# Patient Record
Sex: Male | Born: 1985 | Race: Black or African American | Hispanic: No | Marital: Single
Health system: Southern US, Community
[De-identification: ages and names within clinical notes are randomized; demographics above are authoritative.]

---

## 2019-11-17 ENCOUNTER — Emergency Department (HOSPITAL_COMMUNITY)
Admission: EM | Admit: 2019-11-17 | Discharge: 2019-11-17 | Disposition: A | Discharge status: Home / Self Care | Payer: Medicaid - Out of State | Attending: Emergency Medicine | Admitting: Emergency Medicine

## 2019-11-17 ENCOUNTER — Emergency Department (HOSPITAL_COMMUNITY): Payer: Medicaid - Out of State

## 2019-11-17 ENCOUNTER — Other Ambulatory Visit: Payer: Self-pay

## 2019-11-17 DIAGNOSIS — R519 Headache, unspecified: Secondary | ICD-10-CM | POA: Diagnosis not present

## 2019-11-17 DIAGNOSIS — R1033 Periumbilical pain: Secondary | ICD-10-CM | POA: Insufficient documentation

## 2019-11-17 DIAGNOSIS — R630 Anorexia: Secondary | ICD-10-CM | POA: Insufficient documentation

## 2019-11-17 DIAGNOSIS — Z20822 Contact with and (suspected) exposure to covid-19: Secondary | ICD-10-CM | POA: Insufficient documentation

## 2019-11-17 DIAGNOSIS — R197 Diarrhea, unspecified: Secondary | ICD-10-CM | POA: Diagnosis not present

## 2019-11-17 DIAGNOSIS — R1110 Vomiting, unspecified: Secondary | ICD-10-CM

## 2019-11-17 DIAGNOSIS — R509 Fever, unspecified: Secondary | ICD-10-CM

## 2019-11-17 DIAGNOSIS — R112 Nausea with vomiting, unspecified: Secondary | ICD-10-CM | POA: Insufficient documentation

## 2019-11-17 DIAGNOSIS — R5383 Other fatigue: Secondary | ICD-10-CM | POA: Insufficient documentation

## 2019-11-17 DIAGNOSIS — R000 Tachycardia, unspecified: Secondary | ICD-10-CM | POA: Insufficient documentation

## 2019-11-17 LAB — CBC
HCT: 45.3 % (ref 39.0–52.0)
Hemoglobin: 15.5 g/dL (ref 13.0–17.0)
MCH: 28.7 pg (ref 26.0–34.0)
MCHC: 34.2 g/dL (ref 30.0–36.0)
MCV: 83.7 fL (ref 80.0–100.0)
Platelets: 89 10*3/uL — ABNORMAL LOW (ref 150–400)
RBC: 5.41 MIL/uL (ref 4.22–5.81)
RDW: 12.5 % (ref 11.5–15.5)
WBC: 4.1 10*3/uL (ref 4.0–10.5)
nRBC: 0 % (ref 0.0–0.2)

## 2019-11-17 LAB — URINALYSIS, ROUTINE W REFLEX MICROSCOPIC
Bilirubin Urine: NEGATIVE
Glucose, UA: NEGATIVE mg/dL
Ketones, ur: NEGATIVE mg/dL
Leukocytes,Ua: NEGATIVE
Nitrite: NEGATIVE
Protein, ur: 100 mg/dL — AB
Specific Gravity, Urine: 1.012 (ref 1.005–1.030)
pH: 5 (ref 5.0–8.0)

## 2019-11-17 LAB — RESPIRATORY PANEL BY RT PCR (FLU A&B, COVID)
Influenza A by PCR: NEGATIVE
Influenza B by PCR: NEGATIVE
SARS Coronavirus 2 by RT PCR: NEGATIVE

## 2019-11-17 LAB — COMPREHENSIVE METABOLIC PANEL
ALT: 55 U/L — ABNORMAL HIGH (ref 0–44)
AST: 119 U/L — ABNORMAL HIGH (ref 15–41)
Albumin: 3.6 g/dL (ref 3.5–5.0)
Alkaline Phosphatase: 56 U/L (ref 38–126)
Anion gap: 14 (ref 5–15)
BUN: 15 mg/dL (ref 6–20)
CO2: 21 mmol/L — ABNORMAL LOW (ref 22–32)
Calcium: 8.2 mg/dL — ABNORMAL LOW (ref 8.9–10.3)
Chloride: 95 mmol/L — ABNORMAL LOW (ref 98–111)
Creatinine, Ser: 1.7 mg/dL — ABNORMAL HIGH (ref 0.61–1.24)
GFR, Estimated: 54 mL/min — ABNORMAL LOW (ref >60)
Glucose, Bld: 130 mg/dL — ABNORMAL HIGH (ref 70–99)
Potassium: 4.1 mmol/L (ref 3.5–5.1)
Sodium: 130 mmol/L — ABNORMAL LOW (ref 135–145)
Total Bilirubin: 0.8 mg/dL (ref 0.3–1.2)
Total Protein: 7.9 g/dL (ref 6.5–8.1)

## 2019-11-17 LAB — PROTIME-INR
INR: 1 (ref 0.8–1.2)
Prothrombin Time: 12.3 seconds (ref 11.4–15.2)

## 2019-11-17 LAB — APTT: aPTT: 35 seconds (ref 24–36)

## 2019-11-17 LAB — LACTIC ACID, PLASMA: Lactic Acid, Venous: 1.4 mmol/L (ref 0.5–1.9)

## 2019-11-17 LAB — BASIC METABOLIC PANEL
Anion gap: 8 (ref 5–15)
BUN: 18 mg/dL (ref 6–20)
CO2: 24 mmol/L (ref 22–32)
Calcium: 7.8 mg/dL — ABNORMAL LOW (ref 8.9–10.3)
Chloride: 97 mmol/L — ABNORMAL LOW (ref 98–111)
Creatinine, Ser: 1.73 mg/dL — ABNORMAL HIGH (ref 0.61–1.24)
GFR, Estimated: 53 mL/min — ABNORMAL LOW (ref >60)
Glucose, Bld: 108 mg/dL — ABNORMAL HIGH (ref 70–99)
Potassium: 4.1 mmol/L (ref 3.5–5.1)
Sodium: 129 mmol/L — ABNORMAL LOW (ref 135–145)

## 2019-11-17 LAB — LIPASE, BLOOD: Lipase: 71 U/L — ABNORMAL HIGH (ref 11–51)

## 2019-11-17 MED ORDER — LACTATED RINGERS IV BOLUS
2000.0000 mL | Freq: Once | INTRAVENOUS | Status: AC
  Administered 2019-11-17: 2000 mL via INTRAVENOUS

## 2019-11-17 MED ORDER — ONDANSETRON 4 MG PO TBDP
4.0000 mg | ORAL_TABLET | Freq: Once | ORAL | Status: AC
  Administered 2019-11-17: 4 mg via ORAL
  Filled 2019-11-17: qty 1

## 2019-11-17 MED ORDER — ACETAMINOPHEN 325 MG PO TABS
650.0000 mg | ORAL_TABLET | Freq: Once | ORAL | Status: AC
  Administered 2019-11-17: 650 mg via ORAL
  Filled 2019-11-17: qty 2

## 2019-11-17 MED ORDER — KETOROLAC TROMETHAMINE 15 MG/ML IJ SOLN
15.0000 mg | Freq: Once | INTRAMUSCULAR | Status: AC
  Administered 2019-11-17: 15 mg via INTRAVENOUS
  Filled 2019-11-17: qty 1

## 2019-11-17 MED ORDER — ONDANSETRON 4 MG PO TBDP
4.0000 mg | ORAL_TABLET | Freq: Once | ORAL | Status: AC | PRN
  Administered 2019-11-17: 4 mg via ORAL
  Filled 2019-11-17: qty 1

## 2019-11-17 MED ORDER — ONDANSETRON 4 MG PO TBDP: 4.0000 mg | ORAL_TABLET | Freq: Three times a day (TID) | ORAL | 0 refills | Status: AC | PRN

## 2019-11-17 NOTE — ED Notes (Signed)
Reviewed discharge instructions with patient and significant other. Follow-up care and medications reviewed. Patient and significant other verbalized understanding. Patient A&Ox4, VSS, upon discharge.

## 2019-11-17 NOTE — ED Triage Notes (Addendum)
Pt to ED c/o not feeling well and generalized weakness for 3 days, report febrile, headache, vomiting, unable to keep food down. Pt reports no known COVID exposure, has not been vaccinated for COVID. Reports no pre existing medical problems, does not take any medication

## 2019-11-17 NOTE — Discharge Instructions (Signed)
Hydrate well over the next several days, you need to drink at least 4-5 bottles of water a day.  Use the Zofran for nausea.  Return with worsening belly pain, return with inability to tolerate oral hydration.  When she gets her self evaluated again in a day or 2 either here or by urgent care or with your primary care provider.  Repeat laboratory studies to check for kidney function and signs of dehydration would be warranted

## 2019-11-17 NOTE — ED Provider Notes (Signed)
[facility] EMERGENCY DEPARTMENT Provider Note   CSN: [id number] Arrival date & time: 11/17/19  1221     History Chief Complaint  Patient presents with  . Abdominal Pain  . Fever  . Headache    Benjamin Grimes is a 34 y.o. male.   Abdominal Pain Pain location:  Periumbilical and epigastric Pain quality: aching   Pain radiates to:  Does not radiate Pain severity:  Moderate Onset quality:  Gradual Duration:  4 days Timing:  Constant Progression:  Waxing and waning Chronicity:  New Context: alcohol use and recent illness   Relieved by:  Nothing Worsened by:  Nothing Ineffective treatments:  None tried Associated symptoms: anorexia, chills, diarrhea, fatigue, fever, nausea and vomiting   Associated symptoms: no chest pain, no cough, no dysuria, no hematemesis, no hematochezia, no hematuria and no shortness of breath   Fever Associated symptoms: chills, diarrhea, headaches, nausea and vomiting   Associated symptoms: no chest pain, no congestion, no cough, no dysuria, no rash and no rhinorrhea   Headache Associated symptoms: abdominal pain, diarrhea, fatigue, fever, nausea and vomiting   Associated symptoms: no back pain, no congestion and no cough        No past medical history on file.  There are no problems to display for this patient.    The histories are not reviewed yet. Please review them in the "History" navigator section and refresh this SmartLink.     No family history on file.  Social History   Tobacco Use  . Smoking status: Not on file  Substance Use Topics  . Alcohol use: Not on file  . Drug use: Not on file    Home Medications Prior to Admission medications   Medication Sig Start Date End Date Taking? Authorizing Provider  ibuprofen (ADVIL) 600 MG tablet Take 600 mg by mouth every 6 (six) hours as needed for mild pain.    Yes [provider]  ondansetron (ZOFRAN ODT) 4 MG disintegrating tablet Take 1 tablet (4  mg total) by mouth every 8 (eight) hours as needed for up to 10 doses for nausea or vomiting. 11/17/19   Sabino Donovan, MD    Allergies    Clindamycin  Review of Systems   Review of Systems  Constitutional: Positive for chills, fatigue and fever.  HENT: Negative for congestion and rhinorrhea.   Respiratory: Negative for cough and shortness of breath.   Cardiovascular: Negative for chest pain and palpitations.  Gastrointestinal: Positive for abdominal pain, anorexia, diarrhea, nausea and vomiting. Negative for hematemesis and hematochezia.  Genitourinary: Negative for difficulty urinating, dysuria and hematuria.  Musculoskeletal: Negative for arthralgias and back pain.  Skin: Negative for color change and rash.  Neurological: Positive for headaches. Negative for light-headedness.    Physical Exam Updated Vital Signs BP 115/86   Pulse 93   Temp 98.1 F (36.7 C) (Oral)   Resp (!) 23   Ht 6' (1.829 m)   Wt 118.8 kg   SpO2 96%   BMI 35.53 kg/m   Physical Exam Vitals and nursing note reviewed. Exam conducted with a chaperone present.  Constitutional:      General: He is not in acute distress.    Appearance: Normal appearance.  HENT:     Head: Normocephalic and atraumatic.     Nose: No rhinorrhea.  Eyes:     General:        Right eye: No discharge.        Left eye:  No discharge.     Conjunctiva/sclera: Conjunctivae normal.  Cardiovascular:     Rate and Rhythm: Regular rhythm. Tachycardia present.     Heart sounds: No murmur heard.   Pulmonary:     Effort: Pulmonary effort is normal.     Breath sounds: No stridor.  Abdominal:     General: Abdomen is flat. There is no distension.     Palpations: Abdomen is soft.     Tenderness: There is generalized abdominal tenderness. There is no right CVA tenderness, left CVA tenderness, guarding or rebound. Negative signs include Murphy's sign, Rovsing's sign and McBurney's sign.  Musculoskeletal:        General: No deformity or  signs of injury.  Skin:    General: Skin is warm and dry.  Neurological:     General: No focal deficit present.     Mental Status: He is alert. Mental status is at baseline.     Motor: No weakness.  Psychiatric:        Mood and Affect: Mood normal.        Behavior: Behavior normal.        Thought Content: Thought content normal.     ED Results / Procedures / Treatments   Labs (all labs ordered are listed, but only abnormal results are displayed) Labs Reviewed  LIPASE, BLOOD - Abnormal; Notable for the following components:      Result Value   Lipase 71 (*)    All other components within normal limits  COMPREHENSIVE METABOLIC PANEL - Abnormal; Notable for the following components:   Sodium 130 (*)    Chloride 95 (*)    CO2 21 (*)    Glucose, Bld 130 (*)    Creatinine, Ser 1.70 (*)    Calcium 8.2 (*)    AST 119 (*)    ALT 55 (*)    GFR, Estimated 54 (*)    All other components within normal limits  CBC - Abnormal; Notable for the following components:   Platelets 89 (*)    All other components within normal limits  URINALYSIS, ROUTINE W REFLEX MICROSCOPIC - Abnormal; Notable for the following components:   APPearance HAZY (*)    Hgb urine dipstick MODERATE (*)    Protein, ur 100 (*)    Bacteria, UA RARE (*)    All other components within normal limits  BASIC METABOLIC PANEL - Abnormal; Notable for the following components:   Sodium 129 (*)    Chloride 97 (*)    Glucose, Bld 108 (*)    Creatinine, Ser 1.73 (*)    Calcium 7.8 (*)    GFR, Estimated 53 (*)    All other components within normal limits  RESPIRATORY PANEL BY RT PCR (FLU A&B, COVID)  CULTURE, BLOOD (SINGLE)  URINE CULTURE  LACTIC ACID, PLASMA  PROTIME-INR  APTT    EKG None  Radiology DG Chest Port 1 View  Result Date: 11/17/2019 CLINICAL DATA:  Possible sepsis EXAM: PORTABLE CHEST 1 VIEW COMPARISON:  None. FINDINGS: Numerous leads and wires project over the chest. Midline trachea. Normal heart  size for level of inspiration. No pleural effusion or pneumothorax. Clear lungs. IMPRESSION: Normal chest. Electronically Signed   By: Jeronimo Greaves M.D.   On: 11/17/2019 16:53   US Abdomen Limited RUQ (LIVER/GB)  Result Date: 11/17/2019 CLINICAL DATA:  Vomiting. EXAM: ULTRASOUND ABDOMEN LIMITED RIGHT UPPER QUADRANT COMPARISON:  None. FINDINGS: Gallbladder: No gallstones or wall thickening visualized. No sonographic Murphy sign noted by sonographer.  Common bile duct: Diameter: 3.8 mm Liver: Normal echogenicity without focal lesion or biliary dilatation. Portal vein is patent on color Doppler imaging with normal direction of blood flow towards the liver. Other: None. IMPRESSION: Normal right upper quadrant ultrasound examination. Electronically Signed   By: Rudie Meyer M.D.   On: 11/17/2019 19:22    Procedures Procedures (including critical care time)  Medications Ordered in ED Medications  ondansetron (ZOFRAN-ODT) disintegrating tablet 4 mg (4 mg Oral Given 11/17/19 1245)  acetaminophen (TYLENOL) tablet 650 mg (650 mg Oral Given 11/17/19 1245)  lactated ringers bolus 2,000 mL (0 mLs Intravenous Stopped 11/17/19 1946)  ketorolac (TORADOL) 15 MG/ML injection 15 mg (15 mg Intravenous Given 11/17/19 1723)  ondansetron (ZOFRAN-ODT) disintegrating tablet 4 mg (4 mg Oral Given 11/17/19 1723)    ED Course  I have reviewed the triage vital signs and the nursing notes.  Pertinent labs & imaging results that were available during my care of the patient were reviewed by me and considered in my medical decision making (see chart for details).    MDM Rules/Calculators/A&P                          Gastrointestinal symptoms with fevers chills for 4 days.  Hemodynamically stable, mild tachycardia initially febrile.  Screening labs sent showed no significant leukocytosis, there is an elevation creatinine but no baseline to compare to.  Patient will be hydrated will likely get repeat lab testing.  I  noticed in reviewing his labs his lipase is slightly up, he did have an episode of drinking prior to all this however is likely infectious given the fever.  His abdomen is without signs of peritonitis he is overall well-appearing.  He will be given Toradol he will be given Zofran IV fluids.  Lactic acid pending, culture sent, urinalysis without significant signs of infection, chest x-ray pending.  Ultrasound shows no hepatobiliary dysfunction.  Patient's feeling much better after IV hydration and Zofran.  Repeat laboratory testing shows improvement of metabolic acidosis, consistent creatinine.  With no previous labs and patient not following with primary care for routine screenings I do not have a baseline creatinine and this may be it.  No source for deep space bacterial infection but cultures are pending.  With improved vital signs improved patient status I do feel he is safe for discharge home.  Patient agrees to this and he will get reevaluated in a day or 2.  Pt is safe for DC home with outpatient follow up. The patient  agrees with the plan and has no other questions or concerns.   Final Clinical Impression(s) / ED Diagnoses Final diagnoses:  Vomiting  Diarrhea in adult patient  Fever in adult    Rx / DC Orders ED Discharge Orders         Ordered    ondansetron (ZOFRAN ODT) 4 MG disintegrating tablet  Every 8 hours PRN        11/17/19 2251           Sabino Donovan, MD 11/17/19 2252

## 2019-11-19 ENCOUNTER — Other Ambulatory Visit: Payer: Self-pay

## 2019-11-19 ENCOUNTER — Emergency Department (HOSPITAL_COMMUNITY)
Admission: EM | Admit: 2019-11-19 | Discharge: 2019-11-20 | Disposition: A | Discharge status: Home / Self Care | Payer: Medicaid - Out of State | Attending: Emergency Medicine | Admitting: Emergency Medicine

## 2019-11-19 ENCOUNTER — Encounter (HOSPITAL_COMMUNITY): Payer: Self-pay | Admitting: Emergency Medicine

## 2019-11-19 DIAGNOSIS — K529 Noninfective gastroenteritis and colitis, unspecified: Secondary | ICD-10-CM

## 2019-11-19 DIAGNOSIS — R509 Fever, unspecified: Secondary | ICD-10-CM | POA: Diagnosis not present

## 2019-11-19 DIAGNOSIS — Z20822 Contact with and (suspected) exposure to covid-19: Secondary | ICD-10-CM | POA: Diagnosis not present

## 2019-11-19 DIAGNOSIS — R109 Unspecified abdominal pain: Secondary | ICD-10-CM | POA: Diagnosis present

## 2019-11-19 LAB — CBC
HCT: 42.3 % (ref 39.0–52.0)
Hemoglobin: 14.3 g/dL (ref 13.0–17.0)
MCH: 27.7 pg (ref 26.0–34.0)
MCHC: 33.8 g/dL (ref 30.0–36.0)
MCV: 82 fL (ref 80.0–100.0)
Platelets: 54 10*3/uL — ABNORMAL LOW (ref 150–400)
RBC: 5.16 MIL/uL (ref 4.22–5.81)
RDW: 12.4 % (ref 11.5–15.5)
WBC: 2.3 10*3/uL — ABNORMAL LOW (ref 4.0–10.5)
nRBC: 0 % (ref 0.0–0.2)

## 2019-11-19 LAB — URINE CULTURE: Culture: NO GROWTH

## 2019-11-19 LAB — COMPREHENSIVE METABOLIC PANEL
ALT: 66 U/L — ABNORMAL HIGH (ref 0–44)
AST: 326 U/L — ABNORMAL HIGH (ref 15–41)
Albumin: 3.1 g/dL — ABNORMAL LOW (ref 3.5–5.0)
Alkaline Phosphatase: 42 U/L (ref 38–126)
Anion gap: 12 (ref 5–15)
BUN: 15 mg/dL (ref 6–20)
CO2: 18 mmol/L — ABNORMAL LOW (ref 22–32)
Calcium: 8.2 mg/dL — ABNORMAL LOW (ref 8.9–10.3)
Chloride: 96 mmol/L — ABNORMAL LOW (ref 98–111)
Creatinine, Ser: 1.38 mg/dL — ABNORMAL HIGH (ref 0.61–1.24)
GFR, Estimated: >60 mL/min (ref >60)
Glucose, Bld: 172 mg/dL — ABNORMAL HIGH (ref 70–99)
Potassium: 3.9 mmol/L (ref 3.5–5.1)
Sodium: 126 mmol/L — ABNORMAL LOW (ref 135–145)
Total Bilirubin: 0.7 mg/dL (ref 0.3–1.2)
Total Protein: 6.8 g/dL (ref 6.5–8.1)

## 2019-11-19 LAB — LIPASE, BLOOD: Lipase: 180 U/L — ABNORMAL HIGH (ref 11–51)

## 2019-11-19 MED ORDER — LACTATED RINGERS IV BOLUS
1000.0000 mL | Freq: Once | INTRAVENOUS | Status: AC
  Administered 2019-11-20: 1000 mL via INTRAVENOUS

## 2019-11-19 MED ORDER — ACETAMINOPHEN 325 MG PO TABS
650.0000 mg | ORAL_TABLET | Freq: Once | ORAL | Status: AC
  Administered 2019-11-19: 650 mg via ORAL
  Filled 2019-11-19: qty 2

## 2019-11-19 NOTE — ED Triage Notes (Signed)
Pt states he is here for the same as last time: lethargy, n/v, fevers. States he still feels dehydrated.

## 2019-11-20 ENCOUNTER — Emergency Department (HOSPITAL_COMMUNITY): Payer: Medicaid - Out of State

## 2019-11-20 ENCOUNTER — Other Ambulatory Visit: Payer: Self-pay

## 2019-11-20 LAB — URINALYSIS, ROUTINE W REFLEX MICROSCOPIC
Bilirubin Urine: NEGATIVE
Glucose, UA: NEGATIVE mg/dL
Ketones, ur: NEGATIVE mg/dL
Leukocytes,Ua: NEGATIVE
Nitrite: NEGATIVE
Protein, ur: 100 mg/dL — AB
Specific Gravity, Urine: 1.017 (ref 1.005–1.030)
pH: 6 (ref 5.0–8.0)

## 2019-11-20 LAB — RESPIRATORY PANEL BY RT PCR (FLU A&B, COVID)
Influenza A by PCR: NEGATIVE
Influenza B by PCR: NEGATIVE
SARS Coronavirus 2 by RT PCR: NEGATIVE

## 2019-11-20 LAB — LACTIC ACID, PLASMA: Lactic Acid, Venous: 1.3 mmol/L (ref 0.5–1.9)

## 2019-11-20 MED ORDER — CIPROFLOXACIN HCL 500 MG PO TABS
500.0000 mg | ORAL_TABLET | Freq: Once | ORAL | Status: AC
  Administered 2019-11-20: 500 mg via ORAL
  Filled 2019-11-20: qty 1

## 2019-11-20 MED ORDER — DICYCLOMINE HCL 20 MG PO TABS: 20.0000 mg | ORAL_TABLET | Freq: Three times a day (TID) | ORAL | 0 refills | Status: AC

## 2019-11-20 MED ORDER — ONDANSETRON HCL 4 MG/2ML IJ SOLN
4.0000 mg | Freq: Once | INTRAMUSCULAR | Status: AC
  Administered 2019-11-20: 4 mg via INTRAVENOUS
  Filled 2019-11-20: qty 2

## 2019-11-20 MED ORDER — IOHEXOL 300 MG/ML  SOLN
100.0000 mL | Freq: Once | INTRAMUSCULAR | Status: AC | PRN
  Administered 2019-11-20: 100 mL via INTRAVENOUS

## 2019-11-20 MED ORDER — METRONIDAZOLE 500 MG PO TABS
500.0000 mg | ORAL_TABLET | Freq: Once | ORAL | Status: AC
  Administered 2019-11-20: 500 mg via ORAL
  Filled 2019-11-20: qty 1

## 2019-11-20 MED ORDER — METRONIDAZOLE 500 MG PO TABS: 500.0000 mg | ORAL_TABLET | Freq: Four times a day (QID) | ORAL | 0 refills | Status: AC

## 2019-11-20 MED ORDER — CIPROFLOXACIN HCL 500 MG PO TABS: 500.0000 mg | ORAL_TABLET | Freq: Two times a day (BID) | ORAL | 0 refills | Status: AC

## 2019-11-20 NOTE — ED Notes (Signed)
Pt aware of need for stool sample and not currently able to give stool sample at this time.

## 2019-11-20 NOTE — ED Provider Notes (Signed)
[facility] EMERGENCY DEPARTMENT Provider Note   CSN: [id number] Arrival date & time: 11/19/19  1633     History Chief Complaint  Patient presents with  . Nausea    Benjamin Grimes is a 34 y.o. male.  Patient presents to the emergency department for evaluation of persistent fever, abdominal pain and diarrhea.  Patient reports that since he was seen in the emergency department 2 days ago he has continued to have symptoms.  He has been trying to drink as much water as he can but still feels very weak.  Patient feels diffuse abdominal bloating, cramping and "bubbling".  He still feels nausea but is not having any vomiting.  He reports his temperature was 103 earlier.  No upper respiratory infection symptoms.  He did take antibiotics a couple of weeks ago, cannot remember which one.        History reviewed. No pertinent past medical history.  There are no problems to display for this patient.   History reviewed. No pertinent surgical history.     No family history on file.  Social History   Tobacco Use  . Smoking status: Not on file  Substance Use Topics  . Alcohol use: Not on file  . Drug use: Not on file    Home Medications Prior to Admission medications   Medication Sig Start Date End Date Taking? Authorizing Provider  ciprofloxacin (CIPRO) 500 MG tablet Take 1 tablet (500 mg total) by mouth 2 (two) times daily. 11/20/19   Gilda Crease, MD  dicyclomine (BENTYL) 20 MG tablet Take 1 tablet (20 mg total) by mouth 3 (three) times daily before meals. 11/20/19   Gilda Crease, MD  ibuprofen (ADVIL) 600 MG tablet Take 600 mg by mouth every 6 (six) hours as needed for mild pain.     [provider]  metroNIDAZOLE (FLAGYL) 500 MG tablet Take 1 tablet (500 mg total) by mouth 4 (four) times daily. 11/20/19   Gilda Crease, MD  ondansetron (ZOFRAN ODT) 4 MG disintegrating tablet Take 1 tablet (4 mg total) by mouth every 8  (eight) hours as needed for up to 10 doses for nausea or vomiting. 11/17/19   Sabino Donovan, MD    Allergies    Clindamycin  Review of Systems   Review of Systems  Constitutional: Positive for fatigue and fever.  Gastrointestinal: Positive for abdominal distention, abdominal pain, diarrhea and nausea.  All other systems reviewed and are negative.   Physical Exam Updated Vital Signs BP 111/66   Pulse 96   Temp 99 F (37.2 C) (Oral)   Resp (!) 26   Ht 6' (1.829 m)   Wt 118.8 kg   SpO2 97%   BMI 35.53 kg/m   Physical Exam Vitals and nursing note reviewed.  Constitutional:      General: He is not in acute distress.    Appearance: Normal appearance. He is well-developed.  HENT:     Head: Normocephalic and atraumatic.     Right Ear: Hearing normal.     Left Ear: Hearing normal.     Nose: Nose normal.  Eyes:     Conjunctiva/sclera: Conjunctivae normal.     Pupils: Pupils are equal, round, and reactive to light.  Cardiovascular:     Rate and Rhythm: Regular rhythm.     Heart sounds: S1 normal and S2 normal. No murmur heard.  No friction rub. No gallop.   Pulmonary:     Effort: Pulmonary effort  is normal. No respiratory distress.     Breath sounds: Normal breath sounds.  Chest:     Chest wall: No tenderness.  Abdominal:     General: Bowel sounds are normal.     Palpations: Abdomen is soft.     Tenderness: There is generalized abdominal tenderness. There is no guarding or rebound. Negative signs include Murphy's sign and McBurney's sign.     Hernia: No hernia is present.  Musculoskeletal:        General: Normal range of motion.     Cervical back: Normal range of motion and neck supple.  Skin:    General: Skin is warm and dry.     Findings: No rash.  Neurological:     Mental Status: He is alert and oriented to person, place, and time.     GCS: GCS eye subscore is 4. GCS verbal subscore is 5. GCS motor subscore is 6.     Cranial Nerves: No cranial nerve deficit.      Sensory: No sensory deficit.     Coordination: Coordination normal.  Psychiatric:        Speech: Speech normal.        Behavior: Behavior normal.        Thought Content: Thought content normal.     ED Results / Procedures / Treatments   Labs (all labs ordered are listed, but only abnormal results are displayed) Labs Reviewed  LIPASE, BLOOD - Abnormal; Notable for the following components:      Result Value   Lipase 180 (*)    All other components within normal limits  COMPREHENSIVE METABOLIC PANEL - Abnormal; Notable for the following components:   Sodium 126 (*)    Chloride 96 (*)    CO2 18 (*)    Glucose, Bld 172 (*)    Creatinine, Ser 1.38 (*)    Calcium 8.2 (*)    Albumin 3.1 (*)    AST 326 (*)    ALT 66 (*)    All other components within normal limits  CBC - Abnormal; Notable for the following components:   WBC 2.3 (*)    Platelets 54 (*)    All other components within normal limits  URINALYSIS, ROUTINE W REFLEX MICROSCOPIC - Abnormal; Notable for the following components:   Hgb urine dipstick LARGE (*)    Protein, ur 100 (*)    Bacteria, UA RARE (*)    All other components within normal limits  RESPIRATORY PANEL BY RT PCR (FLU A&B, COVID)  CULTURE, BLOOD (ROUTINE X 2)  CULTURE, BLOOD (ROUTINE X 2)  URINE CULTURE  C DIFFICILE QUICK SCREEN W PCR REFLEX  GASTROINTESTINAL PANEL BY PCR, STOOL (REPLACES STOOL CULTURE)  LACTIC ACID, PLASMA    EKG None  Radiology CT ABDOMEN PELVIS W CONTRAST  Result Date: 11/20/2019 CLINICAL DATA:  Abdominal pain, fever EXAM: CT ABDOMEN AND PELVIS WITH CONTRAST TECHNIQUE: Multidetector CT imaging of the abdomen and pelvis was performed using the standard protocol following bolus administration of intravenous contrast. CONTRAST:  100mL OMNIPAQUE IOHEXOL 300 MG/ML  SOLN COMPARISON:  Ultrasound 11/17/2019 FINDINGS: Lower chest: Lung bases are clear. Normal heart size. No pericardial effusion. Hepatobiliary: No worrisome focal liver  lesions. Smooth liver surface contour. Normal hepatic attenuation. Normal gallbladder and biliary tree. Pancreas: No pancreatic ductal dilatation or surrounding inflammatory changes. Spleen: Normal in size. No concerning splenic lesions. Adrenals/Urinary Tract: Normal adrenal glands. Kidneys are normally located with symmetric enhancement. No suspicious renal lesion, urolithiasis or hydronephrosis. Urinary  bladder is significantly distended albeit without significant wall thickening, perivesicular inflammation, nor visible calculi or debris. Stomach/Bowel: Distal esophagus, stomach and duodenum are unremarkable. No small proximal small bowel thickening or dilatation. There is some questionable focal mural hyperemia involving the terminal ileum and ileocecal valve with few possibly reactive prominent nodes in the right lower quadrant. No adjacent stranding or fluid is seen. No extraluminal gas, organized collection or abscess. Normal appendix is seen in the right lower quadrant. Much of the colon is fluid-filled with a lack of formed stool which could suggest a rapid transit state/diarrheal illness some additional focal thickening is noted at the level of the rectum with surrounding perirectal lymph nodes as well. Some mild mesorectal fat stranding and presacral fluid is noted as well. Vascular/Lymphatic: Prominent, possibly reactive nodes are seen of the the ileocecal valve and in the mesorectal fat. No pathologically enlarged nodes are seen. No significant vascular findings. The prostate and seminal vesicles are unremarkable. Reproductive: The prostate and seminal vesicles are unremarkable. Other: No abdominopelvic free fluid or free gas. No bowel containing hernias. Small fat containing umbilical hernia. Musculoskeletal: No acute osseous abnormality or suspicious osseous lesion. IMPRESSION: 1. Fluid-filled colon suggests a rapid transit state. Some focal mucosal hyperemia is noted at the terminal ileum and  ileocecal valve with few prominent, likely reactive adjacent lymph nodes. Additional thickening and stranding is noted at the level of rectum with some prominent nodes in the mesorectal fat as well. Appearance could suggest an inflammatory enterocolitis such as Crohn's given separate segments of inflammation though infectious enterocolitis is not excluded. Furthermore, should consider direct visualization on an outpatient basis. 2. Urinary bladder is significantly distended albeit without other acute bladder abnormality. Correlate features of retention though possibly voluntary. These results were called by telephone at the time of interpretation on 11/20/2019 at 1:38 am to provider [facility] , who verbally acknowledged these results. Electronically Signed   By: Kreg Shropshire M.D.   On: 11/20/2019 01:38    Procedures Procedures (including critical care time)  Medications Ordered in ED Medications  ciprofloxacin (CIPRO) tablet 500 mg (has no administration in time range)  metroNIDAZOLE (FLAGYL) tablet 500 mg (has no administration in time range)  acetaminophen (TYLENOL) tablet 650 mg (650 mg Oral Given 11/19/19 1646)  lactated ringers bolus 1,000 mL (0 mLs Intravenous Stopped 11/20/19 0205)  ondansetron (ZOFRAN) injection 4 mg (4 mg Intravenous Given 11/20/19 0039)  iohexol (OMNIPAQUE) 300 MG/ML solution 100 mL (100 mLs Intravenous Contrast Given 11/20/19 0100)    ED Course  I have reviewed the triage vital signs and the nursing notes.  Pertinent labs & imaging results that were available during my care of the patient were reviewed by me and considered in my medical decision making (see chart for details).    MDM Rules/Calculators/A&P                          Patient presents to the emergency department for evaluation of fever, diarrhea, abdominal pain.  Patient was seen in the ED 2 days ago with same.  Work-up at that time was reassuring.  He continues to have fevers and diarrhea at  home.  He was unable, however, to give [place] a sample to test for C. difficile and other pathogens.  I did perform a CT scan on the patient to further evaluate.  This does show some evidence of inflammatory changes that could either be infectious or possibly Crohn's.  Will treat  patient for possible infectious etiology and have him follow-up with GI.  Given return precautions.  Final Clinical Impression(s) / ED Diagnoses Final diagnoses:  Colitis    Rx / DC Orders ED Discharge Orders         Ordered    ciprofloxacin (CIPRO) 500 MG tablet  2 times daily        11/20/19 0404    metroNIDAZOLE (FLAGYL) 500 MG tablet  4 times daily        11/20/19 0404    dicyclomine (BENTYL) 20 MG tablet  3 times daily before meals        11/20/19 0404           Gilda Crease, MD 11/20/19 [phone]

## 2019-11-20 NOTE — Discharge Instructions (Addendum)
You have colitis.  This is an inflammation and possible infection of your colon (large intestine).  There is a possibility, however, that you have a disease called Crohn's disease.  This is something that would need very specific treatment and therefore you need to follow-up with the listed gastroenterologist group.  Return to the ER for worsening symptoms.

## 2019-11-20 NOTE — ED Notes (Signed)
Patient transported to CT 

## 2019-11-20 NOTE — ED Notes (Signed)
Pt back from CT scan

## 2019-11-21 LAB — URINE CULTURE: Culture: NO GROWTH

## 2019-11-22 LAB — CULTURE, BLOOD (SINGLE)
Culture: NO GROWTH
Special Requests: ADEQUATE

## 2019-11-23 ENCOUNTER — Encounter: Payer: Self-pay | Admitting: Nurse Practitioner

## 2019-11-25 LAB — CULTURE, BLOOD (ROUTINE X 2)
Culture: NO GROWTH
Culture: NO GROWTH
Special Requests: ADEQUATE
Special Requests: ADEQUATE

## 2019-12-16 ENCOUNTER — Ambulatory Visit: Payer: Medicaid - Out of State | Admitting: Nurse Practitioner

## 2021-11-08 IMAGING — CT CT ABD-PELV W/ CM
2 of 4 series · 15 of 46 positions shown, 17 images · IV contrast (APPLIED)
Comparison: Ultrasound 11/17/2019

CLINICAL DATA: Abdominal pain, fever

EXAM:
CT ABDOMEN AND PELVIS WITH CONTRAST
TECHNIQUE: Multidetector CT imaging of the abdomen and pelvis was performed
using the standard protocol following bolus administration of
intravenous contrast.
CONTRAST:  100mL OMNIPAQUE IOHEXOL 300 MG/ML  SOLN

[Series 3: abdomen 5.0 · axial · 0.96mm/px · z∈[+783,+1208]mm · 12 of 97 slices shown, 14 images]
[im 6/97  soft-tissue]
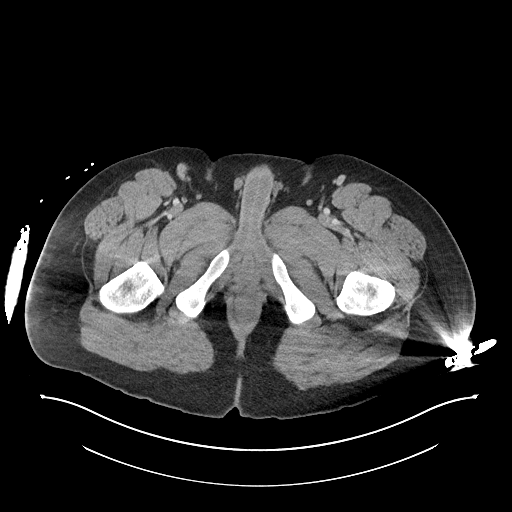
[im 6/97  bone]
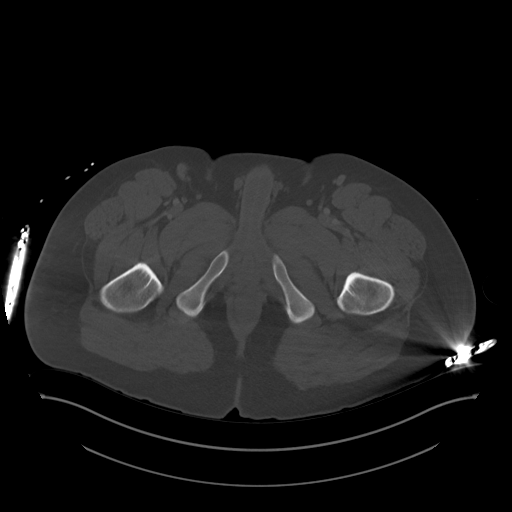
[im 17/97  soft-tissue]
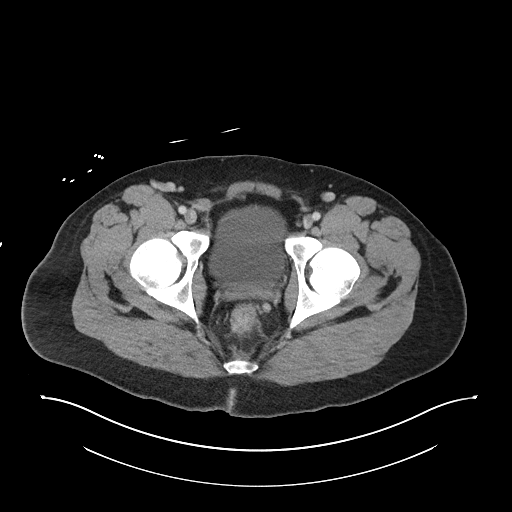
[im 22/97  soft-tissue]
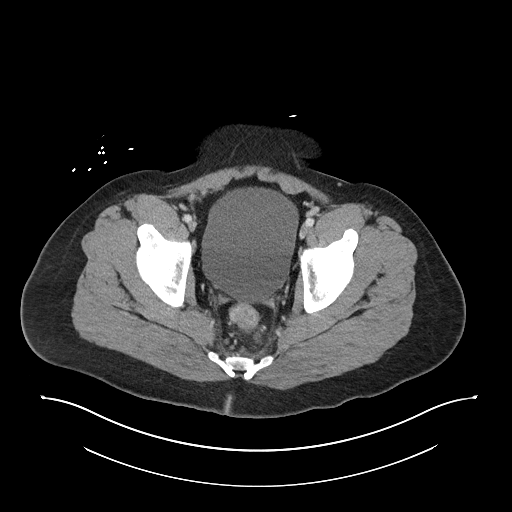
[im 27/97  soft-tissue]
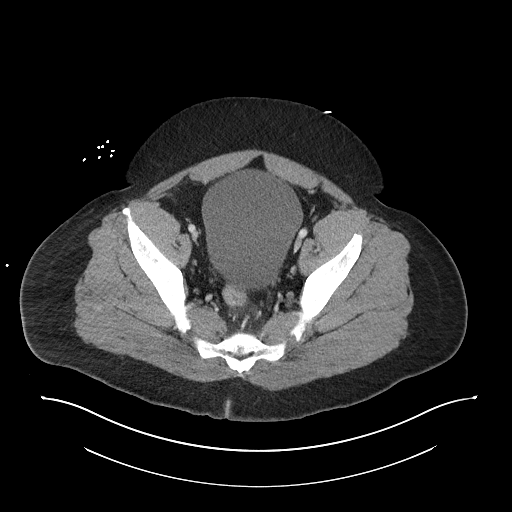
[im 38/97  soft-tissue]
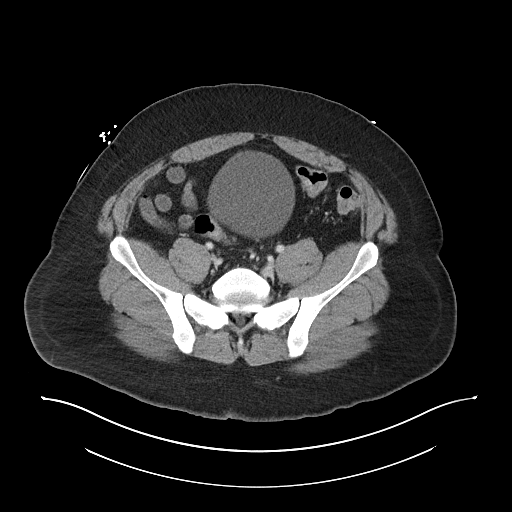
[im 43/97  soft-tissue]
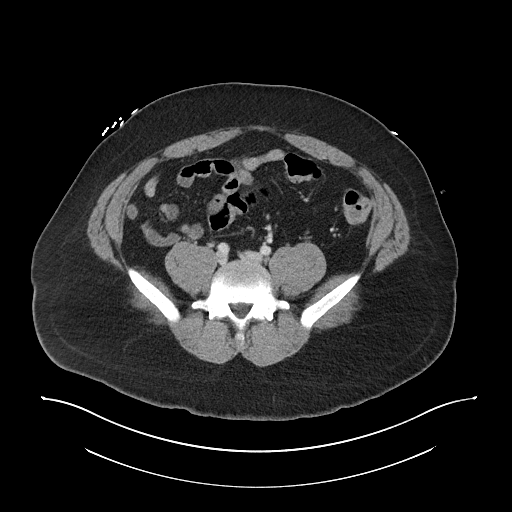
[im 54/97  soft-tissue]
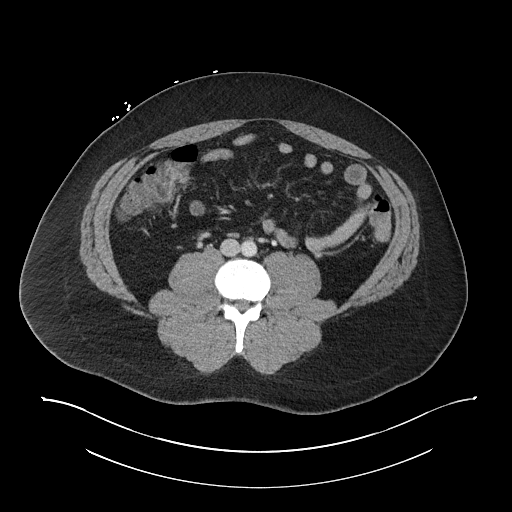
[im 59/97  soft-tissue]
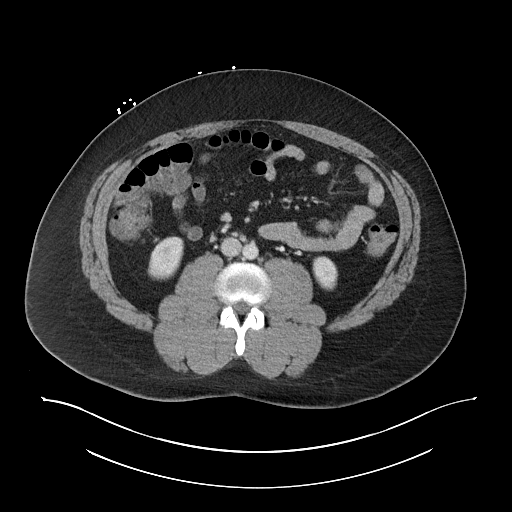
[im 70/97  soft-tissue]
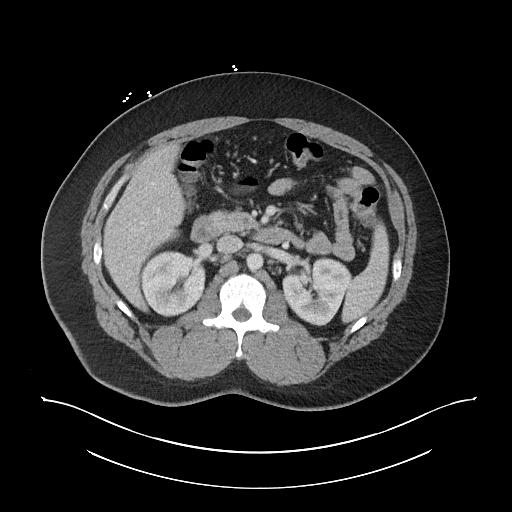
[im 70/97  bone]
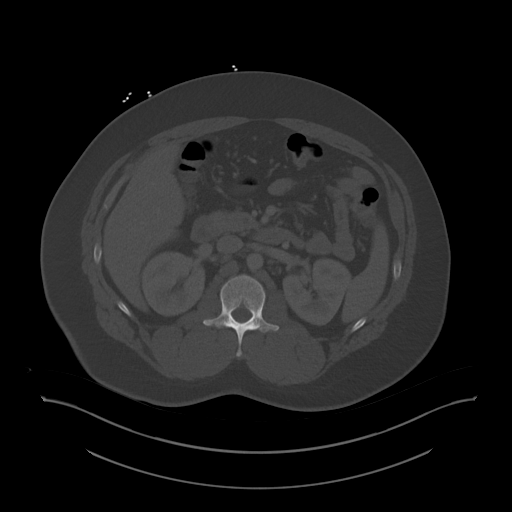
[im 75/97  soft-tissue]
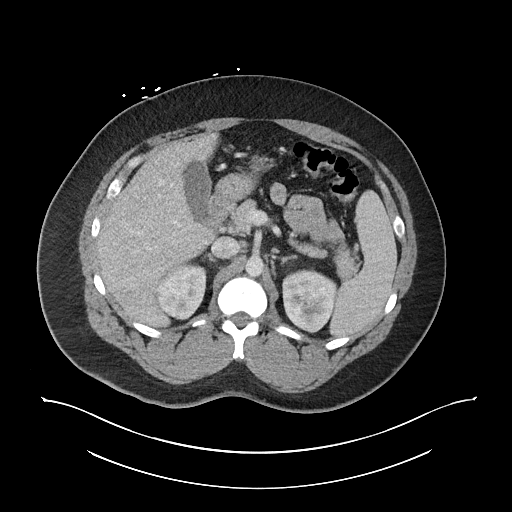
[im 81/97  soft-tissue]
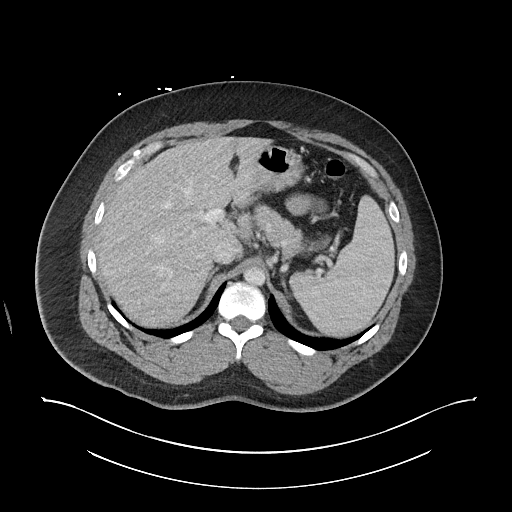
[im 91/97  soft-tissue]
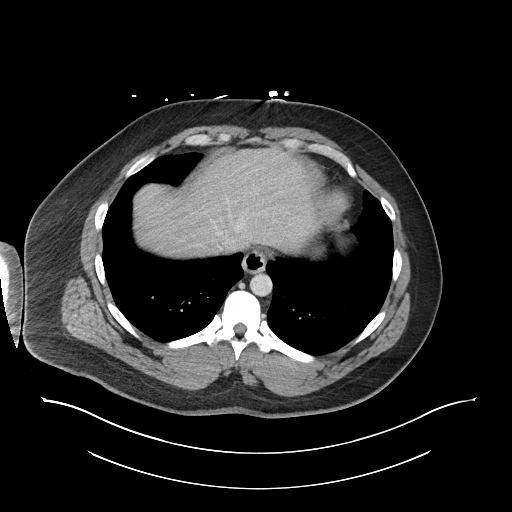

[Series 6: abdomen 3.0 mpr cor · coronal · 0.90mm/px · 3 of 124 slices shown]
[im 42/124  soft-tissue]
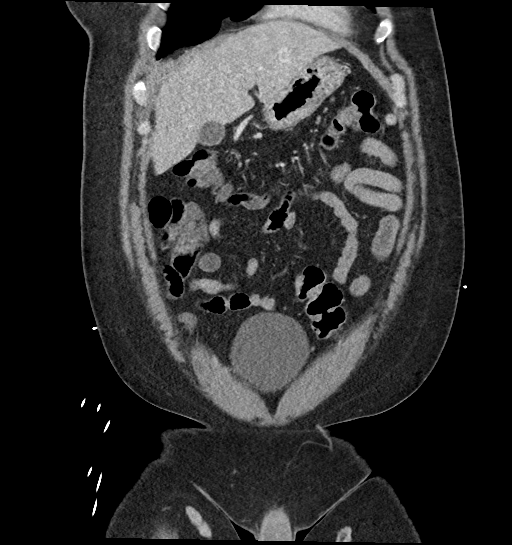
[im 55/124  soft-tissue]
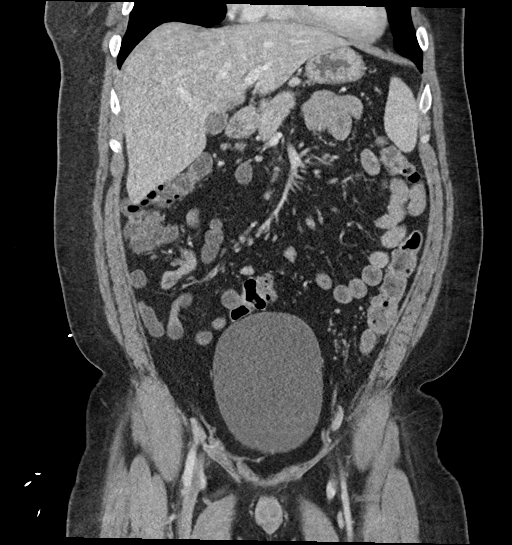
[im 69/124  soft-tissue]
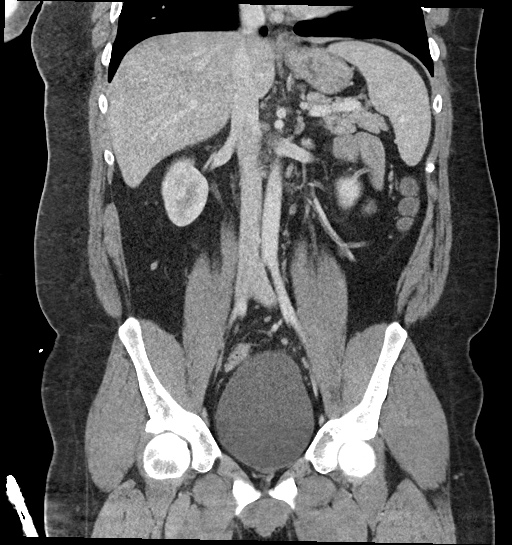

[15 of 46 positions shown; findings below may reference images not displayed]

FINDINGS: Lower chest: Lung bases are clear. Normal heart size. No pericardial
effusion.

Hepatobiliary: No worrisome focal liver lesions. Smooth liver
surface contour. Normal hepatic attenuation. Normal gallbladder and
biliary tree.

Pancreas: No pancreatic ductal dilatation or surrounding
inflammatory changes.

Spleen: Normal in size. No concerning splenic lesions.

Adrenals/Urinary Tract: Normal adrenal glands. Kidneys are normally
located with symmetric enhancement. No suspicious renal lesion,
urolithiasis or hydronephrosis. Urinary bladder is significantly
distended albeit without significant wall thickening, perivesicular
inflammation, nor visible calculi or debris.

Stomach/Bowel: Distal esophagus, stomach and duodenum are
unremarkable. No small proximal small bowel thickening or
dilatation. There is some questionable focal mural hyperemia
involving the terminal ileum and ileocecal valve with few possibly
reactive prominent nodes in the right lower quadrant. No adjacent
stranding or fluid is seen. No extraluminal gas, organized
collection or abscess. Normal appendix is seen in the right lower
quadrant. Much of the colon is fluid-filled with a lack of formed
stool which could suggest a rapid transit state/diarrheal illness
some additional focal thickening is noted at the level of the rectum
with surrounding perirectal lymph nodes as well. Some mild
mesorectal fat stranding and presacral fluid is noted as well.

Vascular/Lymphatic: Prominent, possibly reactive nodes are seen of
the the ileocecal valve and in the mesorectal fat. No pathologically
enlarged nodes are seen. No significant vascular findings. The
prostate and seminal vesicles are unremarkable.

Reproductive: The prostate and seminal vesicles are unremarkable.

Other: No abdominopelvic free fluid or free gas. No bowel containing
hernias. Small fat containing umbilical hernia.

Musculoskeletal: No acute osseous abnormality or suspicious osseous
lesion.
IMPRESSION: 1. Fluid-filled colon suggests a rapid transit state. Some focal
mucosal hyperemia is noted at the terminal ileum and ileocecal valve
with few prominent, likely reactive adjacent lymph nodes. Additional
thickening and stranding is noted at the level of rectum with some
prominent nodes in the mesorectal fat as well. Appearance could
suggest an inflammatory enterocolitis such as Crohn's given separate
segments of inflammation though infectious enterocolitis is not
excluded. Furthermore, should consider direct visualization on an
outpatient basis.
2. Urinary bladder is significantly distended albeit without other
acute bladder abnormality. Correlate features of retention though
possibly voluntary.

These results were called by telephone at the time of interpretation
on 11/20/2019 at [DATE] to provider GAIARIN ELLEBOUDT , who
verbally acknowledged these results.
